# Patient Record
Sex: Female | Born: 1980 | Race: Black or African American | Hispanic: No | Marital: Single | Smoking: Former smoker
Health system: Southern US, Community
[De-identification: ages and names within clinical notes are randomized; demographics above are authoritative.]

## PROBLEM LIST (undated history)

## (undated) DIAGNOSIS — F419 Anxiety disorder, unspecified: Secondary | ICD-10-CM

## (undated) DIAGNOSIS — F209 Schizophrenia, unspecified: Secondary | ICD-10-CM

## (undated) HISTORY — PX: HERNIA REPAIR: SHX51

## (undated) HISTORY — PX: ABDOMINAL HYSTERECTOMY: SHX81

---

## 2001-01-04 ENCOUNTER — Other Ambulatory Visit: Admission: RE | Admit: 2001-01-04 | Discharge: 2001-01-04 | Payer: Self-pay | Admitting: Family Medicine

## 2007-03-21 ENCOUNTER — Emergency Department (HOSPITAL_COMMUNITY): Admission: EM | Admit: 2007-03-21 | Discharge: 2007-03-22 | Payer: Self-pay | Admitting: Emergency Medicine

## 2007-12-07 ENCOUNTER — Emergency Department (HOSPITAL_COMMUNITY): Admission: EM | Admit: 2007-12-07 | Discharge: 2007-12-07 | Payer: Self-pay | Admitting: Emergency Medicine

## 2015-10-24 ENCOUNTER — Encounter (HOSPITAL_COMMUNITY): Payer: Self-pay | Admitting: Emergency Medicine

## 2015-10-24 ENCOUNTER — Emergency Department (HOSPITAL_COMMUNITY): Payer: No Typology Code available for payment source

## 2015-10-24 ENCOUNTER — Emergency Department (HOSPITAL_COMMUNITY)
Admission: EM | Admit: 2015-10-24 | Discharge: 2015-10-24 | Disposition: A | Discharge status: Home / Self Care | Payer: No Typology Code available for payment source | Attending: Emergency Medicine | Admitting: Emergency Medicine

## 2015-10-24 DIAGNOSIS — Z79899 Other long term (current) drug therapy: Secondary | ICD-10-CM | POA: Insufficient documentation

## 2015-10-24 DIAGNOSIS — S93401A Sprain of unspecified ligament of right ankle, initial encounter: Secondary | ICD-10-CM

## 2015-10-24 DIAGNOSIS — S0003XA Contusion of scalp, initial encounter: Secondary | ICD-10-CM | POA: Insufficient documentation

## 2015-10-24 DIAGNOSIS — Y939 Activity, unspecified: Secondary | ICD-10-CM | POA: Insufficient documentation

## 2015-10-24 DIAGNOSIS — F17200 Nicotine dependence, unspecified, uncomplicated: Secondary | ICD-10-CM | POA: Diagnosis not present

## 2015-10-24 DIAGNOSIS — S99911A Unspecified injury of right ankle, initial encounter: Secondary | ICD-10-CM | POA: Diagnosis present

## 2015-10-24 DIAGNOSIS — Y92410 Unspecified street and highway as the place of occurrence of the external cause: Secondary | ICD-10-CM | POA: Insufficient documentation

## 2015-10-24 DIAGNOSIS — M62838 Other muscle spasm: Secondary | ICD-10-CM | POA: Diagnosis not present

## 2015-10-24 DIAGNOSIS — T148XXA Other injury of unspecified body region, initial encounter: Secondary | ICD-10-CM

## 2015-10-24 DIAGNOSIS — Y999 Unspecified external cause status: Secondary | ICD-10-CM | POA: Insufficient documentation

## 2015-10-24 MED ORDER — HYDROMORPHONE HCL 1 MG/ML IJ SOLN
2.0000 mg | Freq: Once | INTRAMUSCULAR | Status: AC
  Administered 2015-10-24: 2 mg via INTRAMUSCULAR
  Filled 2015-10-24: qty 2

## 2015-10-24 MED ORDER — IBUPROFEN 600 MG PO TABS: 600.0000 mg | ORAL_TABLET | Freq: Once | ORAL | Status: AC

## 2015-10-24 MED ORDER — IBUPROFEN 200 MG PO TABS
600.0000 mg | ORAL_TABLET | Freq: Once | ORAL | Status: AC
Start: 2015-10-24 — End: 2015-10-24
  Administered 2015-10-24: 600 mg via ORAL
  Filled 2015-10-24: qty 3

## 2015-10-24 MED ORDER — HYDROCODONE-ACETAMINOPHEN 5-325 MG PO TABS: 1.0000 | ORAL_TABLET | Freq: Four times a day (QID) | ORAL | Status: AC | PRN

## 2015-10-24 MED ORDER — HYDROCODONE-ACETAMINOPHEN 5-325 MG PO TABS
1.0000 | ORAL_TABLET | Freq: Once | ORAL | Status: AC
  Administered 2015-10-24: 1 via ORAL
  Filled 2015-10-24: qty 1

## 2015-10-24 NOTE — Progress Notes (Signed)
Orthopedic Tech Progress Note Patient Details:  Erin Scott 07/31/1980 [id number]  Ortho Devices Type of Ortho Device: Thumb velcro splint Ortho Device/Splint Location: rue Ortho Device/Splint Interventions: Ordered, Application   Trinna PostMartinez, Lenzy Kerschner J 10/24/2015, 1:51 PM

## 2015-10-24 NOTE — ED Notes (Signed)
Patient sent to CT by another staff member.  
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
## 2015-10-24 NOTE — ED Notes (Signed)
Pt drinking ginger ale  

## 2015-10-24 NOTE — ED Notes (Signed)
EDP   verbal to give Dilaudid IV. Medication given IV

## 2015-10-24 NOTE — ED Notes (Signed)
Aspen collar applied
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
## 2016-03-04 ENCOUNTER — Ambulatory Visit (INDEPENDENT_AMBULATORY_CARE_PROVIDER_SITE_OTHER): Payer: Medicaid Other | Admitting: Orthopedic Surgery

## 2016-03-04 DIAGNOSIS — M19071 Primary osteoarthritis, right ankle and foot: Secondary | ICD-10-CM | POA: Diagnosis not present

## 2016-03-04 DIAGNOSIS — M6701 Short Achilles tendon (acquired), right ankle: Secondary | ICD-10-CM | POA: Diagnosis not present

## 2016-03-30 ENCOUNTER — Telehealth (INDEPENDENT_AMBULATORY_CARE_PROVIDER_SITE_OTHER): Payer: Self-pay | Admitting: Orthopedic Surgery
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
## 2016-04-02 NOTE — Telephone Encounter (Signed)
Ok to write out of work note?

## 2016-04-02 NOTE — Telephone Encounter (Signed)
ok 

## 2016-04-05 NOTE — Telephone Encounter (Signed)
Pt. Called about the status of the this note. Pt call back number is [phone]

## 2016-04-06 ENCOUNTER — Encounter (INDEPENDENT_AMBULATORY_CARE_PROVIDER_SITE_OTHER): Payer: Self-pay | Admitting: Radiology

## 2016-04-07 ENCOUNTER — Encounter (INDEPENDENT_AMBULATORY_CARE_PROVIDER_SITE_OTHER): Payer: Self-pay | Admitting: Radiology

## 2016-04-07 NOTE — Telephone Encounter (Signed)
Note emailed to patient per patient request.

## 2016-06-25 ENCOUNTER — Telehealth (INDEPENDENT_AMBULATORY_CARE_PROVIDER_SITE_OTHER): Payer: Self-pay | Admitting: Orthopedic Surgery

## 2016-06-25 NOTE — Telephone Encounter (Signed)
Received vm from patient stating [facility]. I called her back [phone] and left her vm advising that we need a signed medical records release form before we can fax her records to [facility].  I stated that she can come by the office and fill that our or we can mail or fax it to her.
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
## 2018-12-29 NOTE — Progress Notes (Signed)
 Self Swab Type: Anterior Nasal

## 2020-03-31 ENCOUNTER — Other Ambulatory Visit: Payer: Self-pay

## 2020-03-31 ENCOUNTER — Emergency Department (HOSPITAL_COMMUNITY): Payer: Self-pay

## 2020-03-31 ENCOUNTER — Encounter (HOSPITAL_COMMUNITY): Payer: Self-pay | Admitting: Emergency Medicine

## 2020-03-31 ENCOUNTER — Emergency Department (HOSPITAL_COMMUNITY)
Admission: EM | Admit: 2020-03-31 | Discharge: 2020-03-31 | Disposition: A | Discharge status: Home / Self Care | Payer: Self-pay | Attending: Emergency Medicine | Admitting: Emergency Medicine

## 2020-03-31 DIAGNOSIS — F17210 Nicotine dependence, cigarettes, uncomplicated: Secondary | ICD-10-CM | POA: Insufficient documentation

## 2020-03-31 DIAGNOSIS — M79671 Pain in right foot: Secondary | ICD-10-CM | POA: Insufficient documentation
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
## 2020-03-31 NOTE — ED Notes (Signed)
Copies of xrays on CD ordered for pt per Dr. Pilar Plate request
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
## 2020-03-31 NOTE — Discharge Instructions (Addendum)
You were evaluated in the Emergency Department and after careful evaluation, we did not find any emergent condition requiring admission or further testing in the hospital.  Your exam/testing today is overall reassuring.  Your x-ray does show some degenerative changes to your foot.  We recommend orthopedic follow-up and weight loss as we discussed.  Please return to the Emergency Department if you experience any worsening of your condition.   Thank you for allowing [place] to be a part of your care.
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
## 2022-09-14 NOTE — Progress Notes (Signed)
 This 42 year old gravida 1 para 1 patient has had a previous hysterectomy for endometriosis.  She had ovaries left in situ.  She complains of pelvic discomfort involving her bladder and vagina and recent passing of tissue she had an abnormal Pap years ago but apparently this resolved.  When she was examined today she had some extra yeast present which was cultured but otherwise speculum exam and bimanual were normal although her bimanual exam somewhat hampered by her habitus she is 161.7 kg or 356 pounds for BMI of 52.63.  Her blood pressure today 138/80.  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  Given her discomfort and somewhat unusual symptoms ultrasound of her pelvis has been organized to rule out any other possible etiology for her discomfort.  She will be seen on her return for ultrasound.

## 2023-11-27 ENCOUNTER — Other Ambulatory Visit: Payer: Self-pay

## 2023-11-27 ENCOUNTER — Encounter (HOSPITAL_COMMUNITY): Payer: Self-pay | Admitting: *Deleted

## 2023-11-27 ENCOUNTER — Emergency Department (HOSPITAL_COMMUNITY)
Admission: EM | Admit: 2023-11-27 | Discharge: 2023-11-27 | Disposition: A | Discharge status: Home / Self Care | Attending: Emergency Medicine | Admitting: Emergency Medicine

## 2023-11-27 ENCOUNTER — Emergency Department (HOSPITAL_COMMUNITY)

## 2023-11-27 DIAGNOSIS — Z87891 Personal history of nicotine dependence: Secondary | ICD-10-CM | POA: Insufficient documentation

## 2023-11-27 DIAGNOSIS — R519 Headache, unspecified: Secondary | ICD-10-CM | POA: Diagnosis present

## 2023-11-27 HISTORY — DX: Schizophrenia, unspecified: F20.9

## 2023-11-27 HISTORY — DX: Anxiety disorder, unspecified: F41.9

## 2023-11-27 LAB — URINALYSIS, W/ REFLEX TO CULTURE (INFECTION SUSPECTED)
Bacteria, UA: NONE SEEN
Bilirubin Urine: NEGATIVE
Glucose, UA: NEGATIVE mg/dL
Hgb urine dipstick: NEGATIVE
Ketones, ur: NEGATIVE mg/dL
Leukocytes,Ua: NEGATIVE
Nitrite: NEGATIVE
Protein, ur: NEGATIVE mg/dL
Specific Gravity, Urine: 1.009 (ref 1.005–1.030)
pH: 6 (ref 5.0–8.0)

## 2023-11-27 LAB — BASIC METABOLIC PANEL WITH GFR
Anion gap: 6 (ref 5–15)
BUN: 12 mg/dL (ref 6–20)
CO2: 27 mmol/L (ref 22–32)
Calcium: 8.7 mg/dL — ABNORMAL LOW (ref 8.9–10.3)
Chloride: 105 mmol/L (ref 98–111)
Creatinine, Ser: 0.71 mg/dL (ref 0.44–1.00)
GFR, Estimated: >60 mL/min (ref >60)
Glucose, Bld: 107 mg/dL — ABNORMAL HIGH (ref 70–99)
Potassium: 3.8 mmol/L (ref 3.5–5.1)
Sodium: 138 mmol/L (ref 135–145)

## 2023-11-27 LAB — CBC WITH DIFFERENTIAL/PLATELET
Abs Immature Granulocytes: 0.01 K/uL (ref 0.00–0.07)
Basophils Absolute: 0 K/uL (ref 0.0–0.1)
Basophils Relative: 0 %
Eosinophils Absolute: 0.2 K/uL (ref 0.0–0.5)
Eosinophils Relative: 2 %
HCT: 37.3 % (ref 36.0–46.0)
Hemoglobin: 11.4 g/dL — ABNORMAL LOW (ref 12.0–15.0)
Immature Granulocytes: 0 %
Lymphocytes Relative: 26 %
Lymphs Abs: 1.6 K/uL (ref 0.7–4.0)
MCH: 27.6 pg (ref 26.0–34.0)
MCHC: 30.6 g/dL (ref 30.0–36.0)
MCV: 90.3 fL (ref 80.0–100.0)
Monocytes Absolute: 0.4 K/uL (ref 0.1–1.0)
Monocytes Relative: 7 %
Neutro Abs: 4 K/uL (ref 1.7–7.7)
Neutrophils Relative %: 65 %
Platelets: 221 K/uL (ref 150–400)
RBC: 4.13 MIL/uL (ref 3.87–5.11)
RDW: 13.4 % (ref 11.5–15.5)
WBC: 6.3 K/uL (ref 4.0–10.5)
nRBC: 0 % (ref 0.0–0.2)

## 2023-11-27 LAB — HCG, SERUM, QUALITATIVE: Preg, Serum: NEGATIVE

## 2023-11-27 MED ORDER — ACETAMINOPHEN 500 MG PO TABS
1000.0000 mg | ORAL_TABLET | Freq: Once | ORAL | Status: AC
  Administered 2023-11-27: 1000 mg via ORAL
  Filled 2023-11-27: qty 2

## 2023-11-27 NOTE — ED Notes (Signed)
 Per triage RN, pt stated that >my daughter dropped me off and triage RN noted pt having a tote of stuff with her, lot of belongings
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
## 2023-11-27 NOTE — Discharge Instructions (Signed)
 We evaluated you for your headache.  Your testing was reassuring in the emergency department.  We did not see any signs of any head injury.  We also evaluated your back pain.  Your x-ray was normal.  Your laboratory testing was also reassuring and we did not see any signs of kidney infection or urine infection.  Please take Tylenol  (acetaminophen ) and Motrin  (ibuprofen ) for your symptoms at home.  You can take 1000 mg of Tylenol  every 6 hours and 600 mg of Motrin  every 6 hours as needed for your symptoms.  You can take these medicines together as needed, either at the same time, or alternating every 3 hours.  Please follow-up closely with your primary doctor.  Please return for any new or worsening symptoms.  Please also take your Risperdal for your schizophrenia.  Please follow-up with your psychiatric physician tomorrow.

## 2023-11-27 NOTE — ED Notes (Signed)
 Pt unable to provide urine sample at this time
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
## 2023-12-17 ENCOUNTER — Other Ambulatory Visit: Payer: Self-pay

## 2023-12-17 ENCOUNTER — Emergency Department (HOSPITAL_COMMUNITY): Admission: EM | Admit: 2023-12-17 | Discharge: 2023-12-17 | Disposition: A | Discharge status: Home / Self Care

## 2023-12-17 DIAGNOSIS — R1032 Left lower quadrant pain: Secondary | ICD-10-CM | POA: Diagnosis present

## 2023-12-17 LAB — COMPREHENSIVE METABOLIC PANEL WITH GFR
ALT: 12 U/L (ref 0–44)
AST: 18 U/L (ref 15–41)
Albumin: 3.7 g/dL (ref 3.5–5.0)
Alkaline Phosphatase: 61 U/L (ref 38–126)
Anion gap: 9 (ref 5–15)
BUN: 11 mg/dL (ref 6–20)
CO2: 26 mmol/L (ref 22–32)
Calcium: 8.9 mg/dL (ref 8.9–10.3)
Chloride: 107 mmol/L (ref 98–111)
Creatinine, Ser: 0.84 mg/dL (ref 0.44–1.00)
GFR, Estimated: >60 mL/min (ref >60)
Glucose, Bld: 88 mg/dL (ref 70–99)
Potassium: 3.9 mmol/L (ref 3.5–5.1)
Sodium: 142 mmol/L (ref 135–145)
Total Bilirubin: 0.9 mg/dL (ref 0.0–1.2)
Total Protein: 7.4 g/dL (ref 6.5–8.1)

## 2023-12-17 LAB — CBC WITH DIFFERENTIAL/PLATELET
Abs Immature Granulocytes: 0.01 K/uL (ref 0.00–0.07)
Basophils Absolute: 0 K/uL (ref 0.0–0.1)
Basophils Relative: 1 %
Eosinophils Absolute: 0.4 K/uL (ref 0.0–0.5)
Eosinophils Relative: 6 %
HCT: 38.8 % (ref 36.0–46.0)
Hemoglobin: 11.9 g/dL — ABNORMAL LOW (ref 12.0–15.0)
Immature Granulocytes: 0 %
Lymphocytes Relative: 36 %
Lymphs Abs: 2.2 K/uL (ref 0.7–4.0)
MCH: 27.5 pg (ref 26.0–34.0)
MCHC: 30.7 g/dL (ref 30.0–36.0)
MCV: 89.8 fL (ref 80.0–100.0)
Monocytes Absolute: 0.5 K/uL (ref 0.1–1.0)
Monocytes Relative: 9 %
Neutro Abs: 3 K/uL (ref 1.7–7.7)
Neutrophils Relative %: 48 %
Platelets: 222 K/uL (ref 150–400)
RBC: 4.32 MIL/uL (ref 3.87–5.11)
RDW: 13.2 % (ref 11.5–15.5)
WBC: 6.2 K/uL (ref 4.0–10.5)
nRBC: 0 % (ref 0.0–0.2)

## 2023-12-17 LAB — URINALYSIS, ROUTINE W REFLEX MICROSCOPIC
Bacteria, UA: NONE SEEN
Bilirubin Urine: NEGATIVE
Glucose, UA: NEGATIVE mg/dL
Hgb urine dipstick: NEGATIVE
Ketones, ur: NEGATIVE mg/dL
Leukocytes,Ua: NEGATIVE
Nitrite: NEGATIVE
Protein, ur: 30 mg/dL — AB
Specific Gravity, Urine: 1.024 (ref 1.005–1.030)
pH: 5 (ref 5.0–8.0)

## 2023-12-17 LAB — POC OCCULT BLOOD, ED: Fecal Occult Blood: NEGATIVE

## 2023-12-17 LAB — LIPASE, BLOOD: Lipase: 26 U/L (ref 11–51)
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
## 2023-12-17 NOTE — ED Provider Notes (Cosign Needed Addendum)
 [place] EMERGENCY DEPARTMENT AT [facility] Provider Note   CSN: [id number] Arrival date & time: 12/17/23  8176     Patient presents with: Multiple Complaints   Erin Scott is a 43 y.o. female with history of schizophrenia presents with abdominal pain.  States that she feels like she has  magnetized drones in her sigmoid colon.  Denies any nausea or vomiting.  No diarrhea.  Does report blood in her stool.  States her bowel movement was painful.  Denies any ingestion. Feels dehydrated.  Reports that she has periodically missed a dose of her risperidone.  She does admit to hearing auditory hallucinations.  No command hallucinations.  No SI or HI.   HPI  Past Medical History:  Diagnosis Date   Anxiety    Schizophrenia [facility])    Past Surgical History:  Procedure Laterality Date   ABDOMINAL HYSTERECTOMY     HERNIA REPAIR         Prior to Admission medications   Medication Sig Start Date End Date Taking? Authorizing Provider  HYDROcodone -acetaminophen  (NORCO/VICODIN) 5-325 MG tablet Take 1 tablet by mouth every 6 (six) hours as needed for moderate pain. 10/24/15   Charlyn Sora, MD  ibuprofen  (ADVIL ,MOTRIN ) 600 MG tablet Take 1 tablet (600 mg total) by mouth once. 10/24/15   Charlyn Sora, MD    Allergies: Codeine    Review of Systems  Gastrointestinal:  Positive for constipation.    Updated Vital Signs BP 132/73   Pulse 75   Temp 99 F (37.2 C) (Oral)   Resp 20   Ht 5' 9 (1.753 m)   Wt 133.8 kg   SpO2 96%   BMI 43.56 kg/m   Physical Exam Vitals and nursing note reviewed.  Constitutional:      General: She is not in acute distress.    Appearance: She is well-developed.  HENT:     Head: Normocephalic and atraumatic.  Eyes:     Conjunctiva/sclera: Conjunctivae normal.  Cardiovascular:     Rate and Rhythm: Normal rate and regular rhythm.     Heart sounds: No murmur heard. Pulmonary:     Effort: Pulmonary effort is normal. No respiratory  distress.     Breath sounds: Normal breath sounds.  Abdominal:     Palpations: Abdomen is soft.     Tenderness: There is abdominal tenderness.     Comments: Mild LLQ tenderness, no rebound, guarding or peritoneal signs  Musculoskeletal:        General: No swelling.     Cervical back: Neck supple.  Skin:    General: Skin is warm and dry.     Capillary Refill: Capillary refill takes less than 2 seconds.  Neurological:     Mental Status: She is alert.     Comments: Patient is alert and oriented. There is no abnormal phonation. Symmetric smile without facial droop.  Moves all extremities spontaneously. 5/5 strength in upper and lower extremities. . No sensation deficit. There is no nystagmus. EOMI, PERRL. Coordination intact with finger to nose and normal ambulation.    Psychiatric:        Mood and Affect: Mood normal.     (all labs ordered are listed, but only abnormal results are displayed) Labs Reviewed  CBC WITH DIFFERENTIAL/PLATELET - Abnormal; Notable for the following components:      Result Value   Hemoglobin 11.9 (*)    All other components within normal limits  URINALYSIS, ROUTINE W REFLEX MICROSCOPIC - Abnormal; Notable for  the following components:   APPearance HAZY (*)    Protein, ur 30 (*)    All other components within normal limits  POC OCCULT BLOOD, ED - Normal  COMPREHENSIVE METABOLIC PANEL WITH GFR  LIPASE, BLOOD    EKG: EKG Interpretation Date/Time:  Saturday December 17 2023 19:43:29 EDT Ventricular Rate:  81 PR Interval:  167 QRS Duration:  104 QT Interval:  355 QTC Calculation: 412 R Axis:   68  Text Interpretation: Sinus rhythm Normal axis No STEMI No previous EKG from comparison Confirmed by Gennaro Bouchard ([id number]) on 12/17/2023 7:47:08 PM  Radiology: No results found.   Procedures   Medications Ordered in the ED - No data to display                                  Medical Decision Making Amount and/or Complexity of Data Reviewed Labs:  ordered.   This patient presents to the ED with chief complaint(s) of abdominal pain .  The complaint involves an extensive differential diagnosis and also carries with it a high risk of complications and morbidity.   Pertinent past medical history as listed in HPI  The differential diagnosis includes  Schizophrenia, diverticulitis, hemorrhoid, UTI, GI bleed, cellulitis Additional history obtained: Records reviewed Care Everywhere/External Records  Assessment and management:   Hemodynamically stable, nontoxic-appearing patient with schizophrenia presenting with abdominal complaints described as  magnetized drones in her sigmoid.  She does have mild tenderness to her left lower quadrant.  Does not report any nausea, vomiting or diarrhea.  Did report bright red blood bloody stool once.  Notes that this was painful.  No hemorrhoids noted on exam today.  With symptoms ongoing now for the past 2 months and reassuring vitals and lab work today.  Do not feel that any imaging is indicated today.  Patient does admit to missing doses of risperidone.  Discussed with her the importance of consistency.  She has no command hallucinations.  No SI or HI.  She does have family at home.  Do not feel that the patient warrants psychiatric admission.  Emphasized the importance of following up with her outpatient doctors for close management.  Will discharge home with outpatient follow-up.     Independent ECG interpretation:  Sinus rhythm no ischemic changes  Independent labs interpretation:  The following labs were independently interpreted:  CBC without significant abnormality, CMP unremarkable, lipase within normal limits, hCG negative, UA without nitrites, leukocytes or WBCs, Hemoccult negative  Independent visualization and interpretation of imaging: I independently visualized the following imaging with scope of interpretation limited to determining acute life threatening conditions related to emergency care:  none    Consultations obtained:   none  Disposition:   Patient will be discharged home. The patient has been appropriately medically screened and/or stabilized in the ED. I have low suspicion for any other emergent medical condition which would require further screening, evaluation or treatment in the ED or require inpatient management. At time of discharge the patient is hemodynamically stable and in no acute distress. I have discussed work-up results and diagnosis with patient and answered all questions. Patient is agreeable with discharge plan. We discussed strict return precautions for returning to the emergency department and they verbalized understanding.     Social Determinants of Health:   none  This note was dictated with voice recognition software.  Despite best efforts at proofreading, errors may have occurred which can  change the documentation meaning.       Final diagnoses:  Left lower quadrant abdominal pain    ED Discharge Orders     None          Donnajean Lynwood DEL, PA-C 12/17/23 2054    Donnajean Lynwood DEL, PA-C 12/17/23 2055    Gennaro Duwaine CROME, DO 12/19/23 KENITH

## 2023-12-17 NOTE — ED Triage Notes (Addendum)
 Pt stated that she feels dehydrated and that she has something is her colon that wont come out due to magnetized drones that people fly around her home? Unsure what she is trying to explain but nurse asked if she meant that she felt constipated and she said yes. Pt also has several bags of belongings with her

## 2023-12-17 NOTE — Discharge Instructions (Addendum)
 You were evaluated in the emergency room for abdominal pain.  Your lab work did not show any significant abnormality.  If you experience any worsening symptoms please return to the emergency room.  Otherwise please follow-up with your outpatient doctors.
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
